# Patient Record
Sex: Female | Born: 1940 | Hispanic: No | Smoking: Former smoker
Health system: Southern US, Community
[De-identification: ages and names within clinical notes are randomized; demographics above are authoritative.]

---

## 2019-09-03 ENCOUNTER — Other Ambulatory Visit: Payer: Self-pay | Admitting: Acute Care

## 2019-09-03 DIAGNOSIS — I639 Cerebral infarction, unspecified: Secondary | ICD-10-CM

## 2019-09-15 ENCOUNTER — Other Ambulatory Visit: Payer: Self-pay

## 2019-09-15 ENCOUNTER — Ambulatory Visit
Admission: RE | Admit: 2019-09-15 | Discharge: 2019-09-15 | Disposition: A | Discharge status: Home / Self Care | Payer: Medicare Other | Source: Ambulatory Visit | Attending: Acute Care | Admitting: Acute Care

## 2019-09-15 DIAGNOSIS — I639 Cerebral infarction, unspecified: Secondary | ICD-10-CM

## 2019-09-22 ENCOUNTER — Ambulatory Visit
Admission: EM | Admit: 2019-09-22 | Discharge: 2019-09-22 | Disposition: A | Discharge status: Home / Self Care | Payer: Medicare Other | Attending: Family Medicine | Admitting: Family Medicine

## 2019-09-22 ENCOUNTER — Other Ambulatory Visit: Payer: Self-pay

## 2019-09-22 DIAGNOSIS — S61216A Laceration without foreign body of right little finger without damage to nail, initial encounter: Secondary | ICD-10-CM | POA: Diagnosis not present

## 2019-09-22 NOTE — Discharge Instructions (Signed)
Keep clean.  Return in 7 days for removal.  Take care  Dr. Adriana Simas

## 2019-09-22 NOTE — ED Provider Notes (Signed)
[facility]    CSN: [id number] Arrival date & time: 09/22/19  1214      History   Chief Complaint Chief Complaint  Patient presents with  . Laceration   HPI  79 year old female presents with a laceration.  Patient suffered a laceration to her right fifth digit today while reaching into a glass jar.  Occurred approximately 1 hour prior to arrival.  She has had difficulty getting the wound to stop bleeding.  Pain 7/10 in severity.  Tetanus up-to-date.  No other associated symptoms.  No other complaints or concerns at this time.  Home Medications    Prior to Admission medications   Medication Sig Start Date End Date Taking? Authorizing Provider  clopidogrel (PLAVIX) 75 MG tablet Take 75 mg by mouth daily. 08/06/19  Yes [provider]  famotidine (PEPCID) 10 MG tablet Take by mouth.   Yes [provider]  FLUoxetine (PROZAC) 20 MG capsule Take 60 mg by mouth every morning. 09/12/19  Yes [provider]  gabapentin (NEURONTIN) 100 MG capsule  09/02/19  Yes [provider]  levothyroxine (SYNTHROID) 75 MCG tablet Take 75 mcg by mouth daily. 08/06/19  Yes [provider]  rosuvastatin (CRESTOR) 5 MG tablet Take 5 mg by mouth daily. 07/31/19  Yes [provider]   Social History Social History   Tobacco Use  . Smoking status: Former Research scientist (life sciences)  . Smokeless tobacco: Never Used  Substance Use Topics  . Alcohol use: Not Currently  . Drug use: Not Currently     Allergies   Patient has no known allergies.   Review of Systems Review of Systems  Constitutional: Negative.   Skin: Positive for wound.   Physical Exam Triage Vital Signs ED Triage Vitals  Enc Vitals Group     BP 09/22/19 1232 121/72     Pulse Rate 09/22/19 1232 70     Resp 09/22/19 1232 16     Temp 09/22/19 1232 99 F (37.2 C)     Temp Source 09/22/19 1232 Oral     SpO2 09/22/19 1232 98 %     Weight 09/22/19 1228 140 lb (63.5 kg)     Height 09/22/19  1228 5\' 2"  (1.575 m)     Head Circumference --      Peak Flow --      Pain Score 09/22/19 1228 7     Pain Loc --      Pain Edu? --      Excl. in [place]? --    Updated Vital Signs BP 121/72 (BP Location: Left Arm)   Pulse 70   Temp 99 F (37.2 C) (Oral)   Resp 16   Ht 5\' 2"  (1.575 m)   Wt 63.5 kg   SpO2 98%   BMI 25.61 kg/m   Visual Acuity Right Eye Distance:   Left Eye Distance:   Bilateral Distance:    Right Eye Near:   Left Eye Near:    Bilateral Near:     Physical Exam Vitals and nursing note reviewed.  Constitutional:      General: She is not in acute distress.    Appearance: Normal appearance. She is not ill-appearing.  HENT:     Head: Normocephalic and atraumatic.  Eyes:     General:        Right eye: No discharge.        Left eye: No discharge.     Conjunctiva/sclera: Conjunctivae normal.  Pulmonary:     Effort:  Pulmonary effort is normal. No respiratory distress.  Skin:    Comments: Approximately 1 cm laceration noted to the proximal, right fifth digit.  Neurological:     Mental Status: She is alert.  Psychiatric:        Mood and Affect: Mood normal.        Behavior: Behavior normal.    UC Treatments / Results  Labs (all labs ordered are listed, but only abnormal results are displayed) Labs Reviewed - No data to display  EKG   Radiology No results found.  Procedures Laceration Repair  Date/Time: 09/22/2019 1:42 PM Performed by: Tommie Sams, DO Authorized by: Tommie Sams, DO   Consent:    Consent obtained:  Verbal   Consent given by:  Patient Anesthesia (see MAR for exact dosages):    Anesthesia method:  Local infiltration   Local anesthetic:  Lidocaine 1% WITH epi Laceration details:    Location:  Finger   Finger location:  R small finger   Length (cm):  1 Repair type:    Repair type:  Simple Pre-procedure details:    Preparation:  Patient was prepped and draped in usual sterile fashion Exploration:    Hemostasis achieved  with:  Direct pressure and epinephrine   Contaminated: no   Treatment:    Area cleansed with:  Betadine   Amount of cleaning:  Standard Skin repair:    Repair method:  Sutures   Suture size:  5-0   Suture material:  Nylon   Number of sutures:  3 Approximation:    Approximation:  Close Post-procedure details:    Dressing:  Non-adherent dressing   Patient tolerance of procedure:  Tolerated well, no immediate complications   (including critical care time)  Medications Ordered in UC Medications - No data to display  Initial Impression / Assessment and Plan / UC Course  I have reviewed the triage vital signs and the nursing notes.  Pertinent labs & imaging results that were available during my care of the patient were reviewed by me and considered in my medical decision making (see chart for details).    79 year old female presents with a laceration.  Repaired as above.  Sutures out in 7 days.  Supportive care.  Final Clinical Impressions(s) / UC Diagnoses   Final diagnoses:  Laceration of right little finger without foreign body without damage to nail, initial encounter     Discharge Instructions     Keep clean.  Return in 7 days for removal.  Take care  Dr. Adriana Simas    ED Prescriptions    None     PDMP not reviewed this encounter.   Tommie Sams, [place] 09/22/19 1343

## 2019-09-22 NOTE — ED Triage Notes (Signed)
Patient complains of right pinky finger laceration that occurred by the top of a glass jar. Patient is currently on Plavix. States that this happened around 1 hour ago.

## 2019-09-29 ENCOUNTER — Encounter: Payer: Self-pay | Admitting: Emergency Medicine

## 2019-09-29 ENCOUNTER — Other Ambulatory Visit: Payer: Self-pay

## 2019-09-29 ENCOUNTER — Ambulatory Visit
Admission: EM | Admit: 2019-09-29 | Discharge: 2019-09-29 | Disposition: A | Discharge status: Home / Self Care | Payer: Medicare Other

## 2019-09-29 NOTE — ED Triage Notes (Signed)
Pt presents to [facility] today for suture removal. She has 3 sutures placed on 09/22/19 on her right pinky finger. Wound is healing well.

## 2019-09-29 NOTE — ED Notes (Signed)
3 sutures removed. Pt tolerated well. °

## 2020-08-25 ENCOUNTER — Other Ambulatory Visit: Payer: Self-pay | Admitting: Pediatrics

## 2020-08-25 DIAGNOSIS — R42 Dizziness and giddiness: Secondary | ICD-10-CM

## 2020-08-25 DIAGNOSIS — G459 Transient cerebral ischemic attack, unspecified: Secondary | ICD-10-CM

## 2020-08-26 ENCOUNTER — Ambulatory Visit
Admission: RE | Admit: 2020-08-26 | Discharge: 2020-08-26 | Disposition: A | Discharge status: Home / Self Care | Payer: Medicare Other | Source: Ambulatory Visit | Attending: Pediatrics | Admitting: Pediatrics

## 2020-08-26 ENCOUNTER — Ambulatory Visit
Admission: RE | Admit: 2020-08-26 | Discharge: 2020-08-26 | Disposition: A | Discharge status: Home / Self Care | Payer: Medicare Other | Source: Home / Self Care | Attending: Pediatrics | Admitting: Pediatrics

## 2020-08-26 ENCOUNTER — Other Ambulatory Visit: Payer: Self-pay

## 2020-08-26 ENCOUNTER — Other Ambulatory Visit: Payer: Self-pay | Admitting: Pediatrics

## 2020-08-26 DIAGNOSIS — R0609 Other forms of dyspnea: Secondary | ICD-10-CM

## 2020-08-26 DIAGNOSIS — G459 Transient cerebral ischemic attack, unspecified: Secondary | ICD-10-CM

## 2020-08-26 DIAGNOSIS — Z8616 Personal history of COVID-19: Secondary | ICD-10-CM | POA: Insufficient documentation

## 2020-08-26 DIAGNOSIS — R0600 Dyspnea, unspecified: Secondary | ICD-10-CM | POA: Diagnosis present

## 2020-08-26 DIAGNOSIS — R053 Chronic cough: Secondary | ICD-10-CM | POA: Diagnosis present

## 2020-08-26 DIAGNOSIS — R42 Dizziness and giddiness: Secondary | ICD-10-CM

## 2020-08-26 MED ORDER — GADOBUTROL 1 MMOL/ML IV SOLN
6.0000 mL | Freq: Once | INTRAVENOUS | Status: AC | PRN
  Administered 2020-08-26: 6 mL via INTRAVENOUS

## 2022-05-19 IMAGING — MR MR HEAD W/O CM
12 series · 46 of 48 positions shown · non-contrast
Comparison: None.

CLINICAL DATA: Balance disturbance with falling.

EXAM:
MRI HEAD WITHOUT CONTRAST
TECHNIQUE: Multiplanar, multiecho pulse sequences of the brain and surrounding
structures were obtained without intravenous contrast.

[Series 5: ax dwi_tracew · axial · 3.0mm · 0.60mm/px · z∈[-126,+29]mm · 3 of 48 slices shown]
[im 1/48]
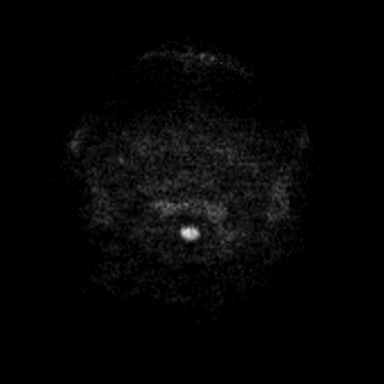
[im 24/48]
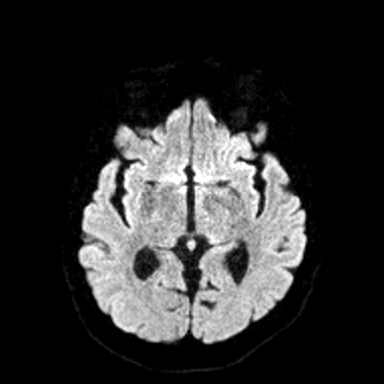
[im 48/48]
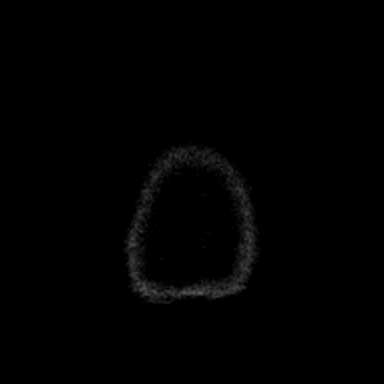

[Series 6: ax dwi_adc · axial · 3.0mm · 0.60mm/px · z∈[-126,+29]mm · 3 of 48 slices shown]
[im 1/48]
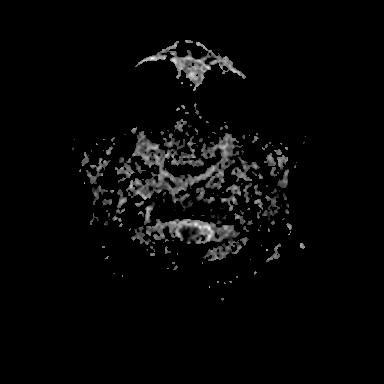
[im 24/48]
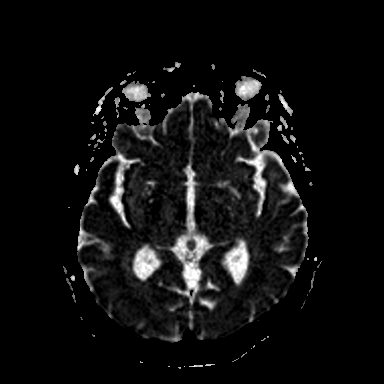
[im 48/48]
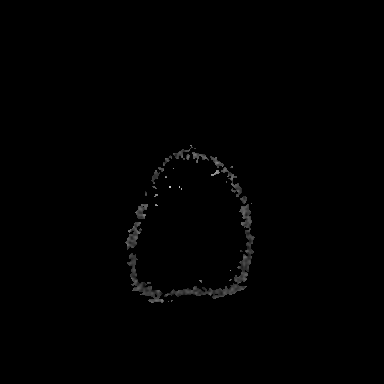

[Series 7: cor dwi_tracew · coronal · 5.0mm · 0.60mm/px · 3 of 34 slices shown]
[im 1/34]
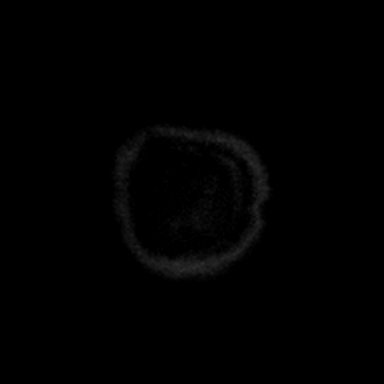
[im 17/34]
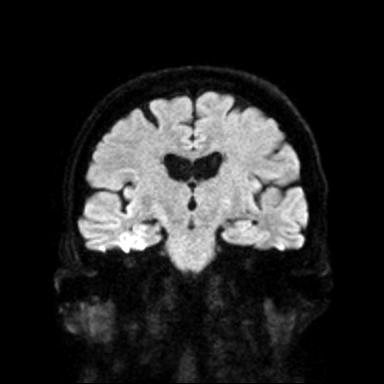
[im 34/34]
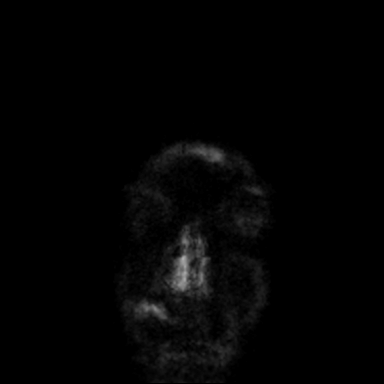

[Series 8: cor dwi_adc · coronal · 5.0mm · 0.60mm/px · 3 of 34 slices shown]
[im 1/34]
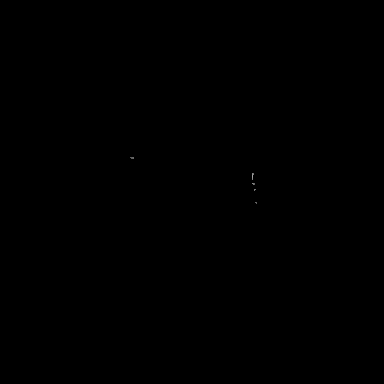
[im 17/34]
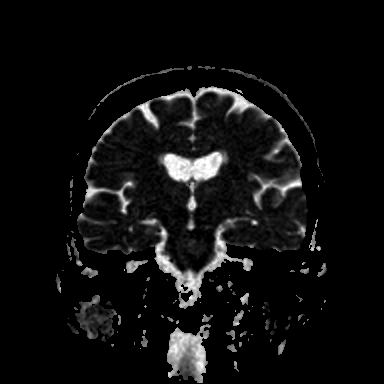
[im 34/34]
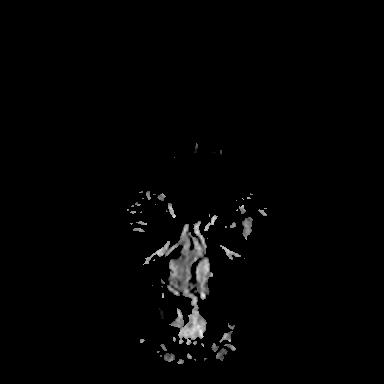

[Series 9: T1 · sagittal · 5.0mm · 0.62mm/px · 2 of 22 slices shown (1 of 2)]
[im 1/22]
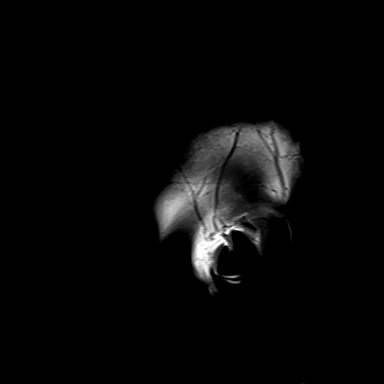
[im 22/22]
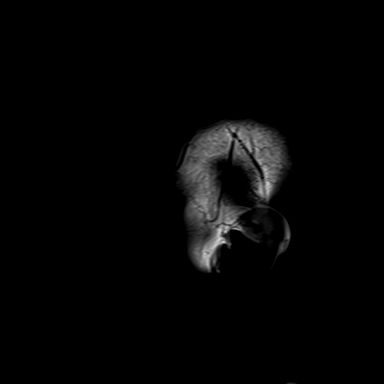

[Series 10: T2 · axial · 5.0mm · 0.53mm/px · z∈[-120,+23]mm · 2 of 25 slices shown (1 of 2)]
[im 1/25]
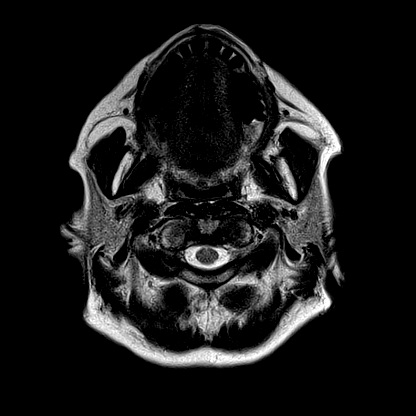
[im 25/25]
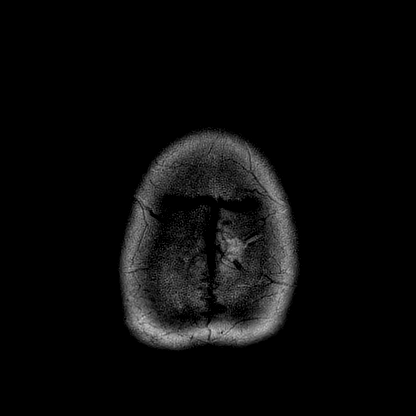

[Series 11: mag_images · axial · 3.0mm · 0.90mm/px · z∈[-136,+40]mm · 5 of 60 slices shown]
[im 1/60]
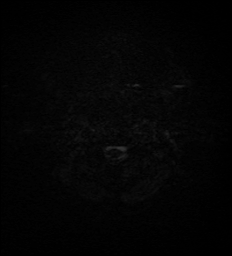
[im 15/60]
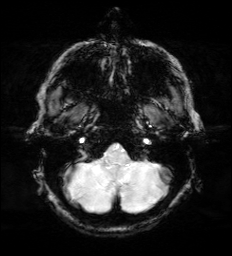
[im 30/60]
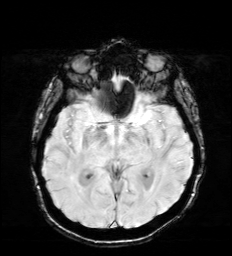
[im 45/60]
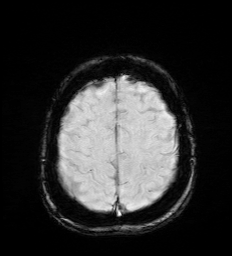
[im 60/60]
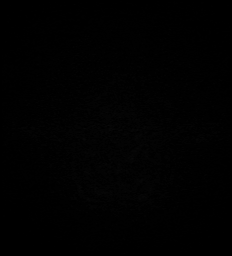

[Series 12: pha_images · axial · 3.0mm · 0.90mm/px · z∈[-133,+40]mm · 4 of 58 slices shown]
[im 1/58]
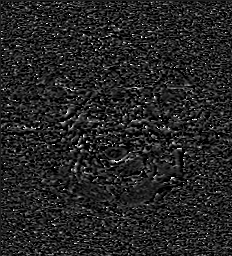
[im 20/58]
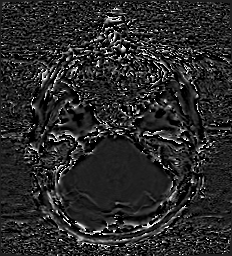
[im 39/58]
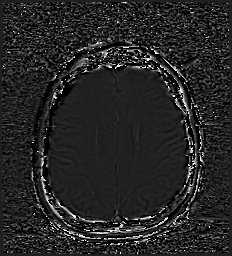
[im 58/58]
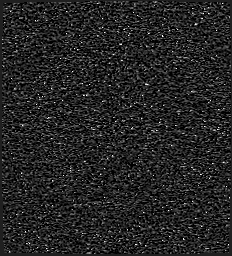

[Series 13: swi_images · axial · 3.0mm · 0.90mm/px · z∈[-136,+40]mm · 5 of 60 slices shown]
[im 1/60]
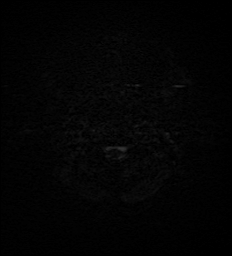
[im 15/60]
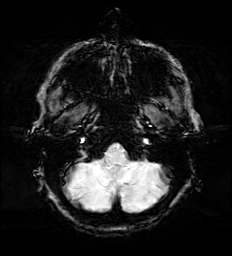
[im 30/60]
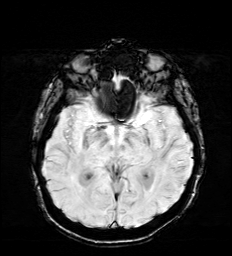
[im 45/60]
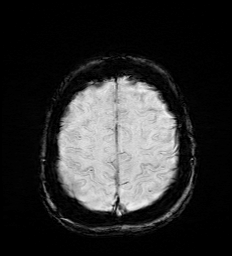
[im 60/60]
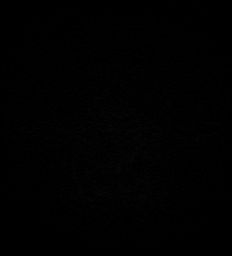

[Series 15: FLAIR · axial · 3.0mm · 0.53mm/px · z∈[-129,+32]mm · 4 of 55 slices shown]
[im 1/55]
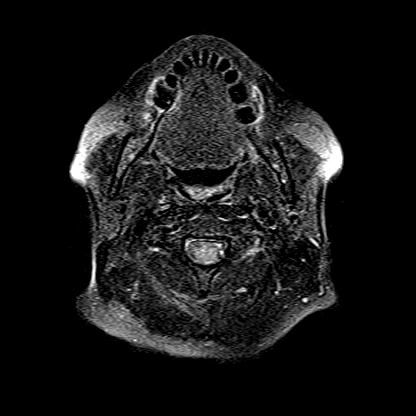
[im 19/55]
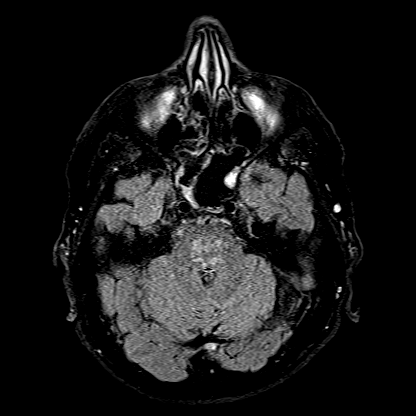
[im 37/55]
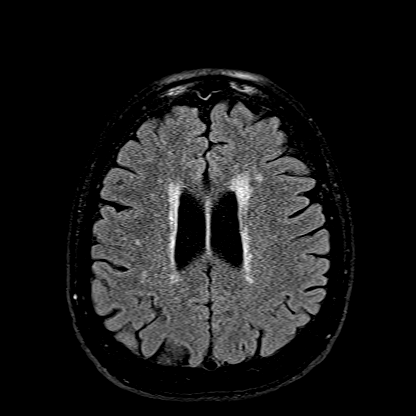
[im 55/55]
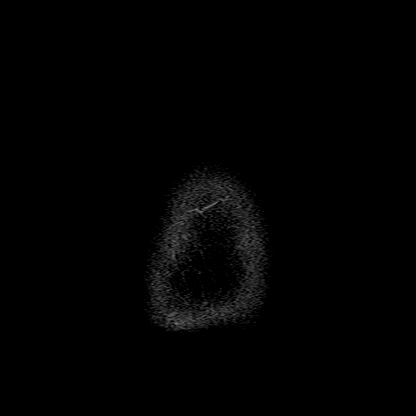

[Series 16: T1 · axial · 1.0mm · 0.98mm/px · z∈[-127,+31]mm · 10 of 160 slices shown (2 of 2)]
[im 1/160]
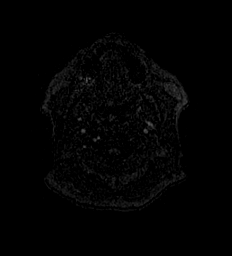
[im 15/160]
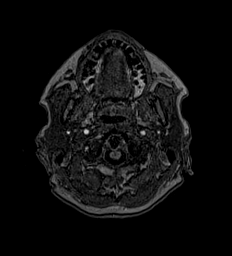
[im 29/160]
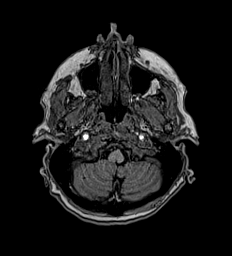
[im 44/160]
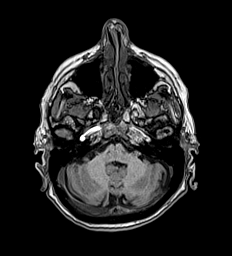
[im 58/160]
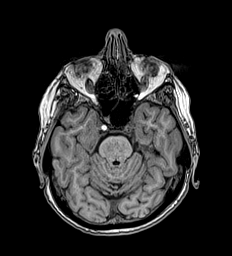
[im 73/160]
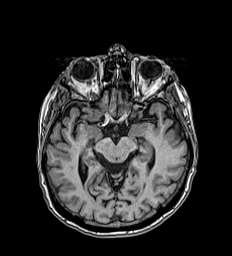
[im 87/160]
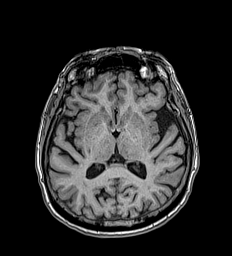
[im 116/160]
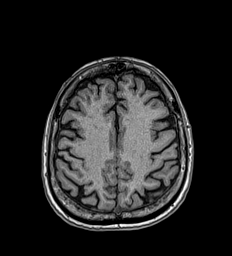
[im 131/160]
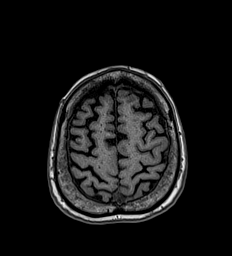
[im 160/160]
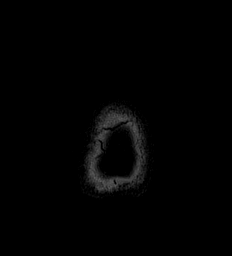

[Series 17: T2 · coronal · 5.0mm · 0.57mm/px · 2 of 27 slices shown (2 of 2)]
[im 1/27]
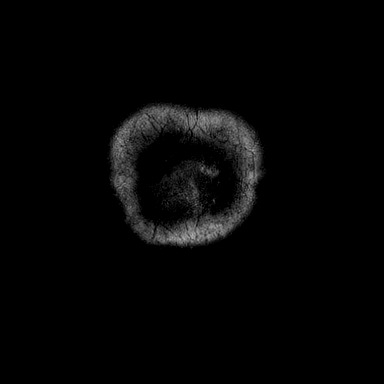
[im 27/27]
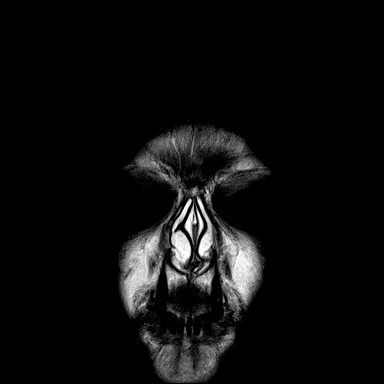

[46 of 48 positions shown; findings below may reference images not displayed]

FINDINGS: Brain: Diffusion imaging does not show any acute or subacute
infarction. Mild chronic small-vessel ischemic change affects the
pons. Few old small vessel cerebellar infarctions. Cerebral
hemispheres show mild chronic small-vessel ischemic change of the
deep white matter considering age. No cortical or large vessel
territory infarction. No mass lesion, hemorrhage, hydrocephalus or
extra-axial collection.

Vascular: Major vessels at the base of the brain show flow.

Skull and upper cervical spine: Normal

Sinuses/Orbits: Mucosal inflammatory changes of the sphenoid sinus.
Other sinuses are clear. Orbits negative.

Other: None
IMPRESSION: No acute finding. Chronic small-vessel ischemic changes of the pons,
cerebellum and cerebral hemispheric white matter would be
categorized as mild to at most moderate.

Mucosal inflammatory change of the sphenoid sinus. Other sinuses
clear.
